# Patient Record
Sex: Male | Born: 1960 | Race: White
Health system: Northeastern US, Academic
[De-identification: ages and names within clinical notes are randomized; demographics above are authoritative.]

---

## 2004-08-13 ENCOUNTER — Emergency Department (HOSPITAL_COMMUNITY): Admission: EM | Admit: 2004-08-13 | Discharge: 2004-08-13 | Payer: Self-pay | Admitting: Emergency Medicine

## 2005-08-22 ENCOUNTER — Emergency Department (HOSPITAL_COMMUNITY): Admission: EM | Admit: 2005-08-22 | Discharge: 2005-08-22 | Payer: Self-pay | Admitting: Emergency Medicine

## 2016-12-02 LAB — CMP (EXT)
ALT/SGPT (EXT): 16 U/L (ref 10–49)
AST/SGOT (EXT): 18 U/L (ref 6–40)
Albumin (EXT): 4.6 g/dL (ref 3.5–4.8)
Alkaline Phosphatase (EXT): 79 U/L (ref 27–129)
Anion Gap (EXT): 13 mmol/L (ref 3–17)
BUN (EXT): 12 mg/dL (ref 9–23)
Bilirubin, Total (EXT): 0.3 mg/dL (ref 0.0–1.0)
CO2 (EXT): 28 mmol/L (ref 20–31)
CalciumCalcium (EXT): 9.3 mg/dL (ref 8.7–10.4)
Chloride (EXT): 102 mmol/L (ref 95–106)
Creatinine (EXT): 1 mg/dL (ref 0.50–1.30)
GFR Estimated (Calc) (EXT): 83 mL/min/{1.73_m2} (ref >60)
Globulin (EXT): 2.5 g/dL (ref 1.9–4.1)
Glucose (EXT): 76 mg/dL (ref 74–106)
Potassium (EXT): 4.3 mmol/L (ref 3.5–5.2)
Protein (EXT): 7.1 g/dL (ref 5.7–8.2)
Sodium (EXT): 143 mmol/L (ref 136–145)

## 2016-12-02 LAB — LIPID PROFILE (EXT)
Cholesterol (EXT): 182 mg/dL (ref 140–200)
HDL Cholesterol (EXT): 39 mg/dL — ABNORMAL LOW (ref >40)
LDL Cholesterol, CALC (EXT): 119 mg/dL (ref 0–129)
Risk Factor (EXT): 4.7 (ref 0–5)
Triglycerides (EXT): 118 mg/dL (ref 0–149)

## 2016-12-02 LAB — HEMOGLOBIN A1C
Estimated Average Glucose mg/dL (INT/EXT): 100 mg/dL
HEMOGLOBIN A1C % (INT/EXT): 5.1 % (ref 4.3–5.6)

## 2016-12-02 LAB — PSA SCREENING (EXT): PSA (EXT): 2.05 ng/mL (ref 0–4.00)

## 2016-12-02 LAB — UNMAPPED LAB RESULTS: Glucose (EXT): 87 mg/dL (ref 74–106)

## 2017-02-01 ENCOUNTER — Emergency Department (HOSPITAL_COMMUNITY)
Admission: EM | Admit: 2017-02-01 | Discharge: 2017-02-02 | Disposition: A | Discharge status: Home / Self Care | Payer: 59 | Attending: Emergency Medicine | Admitting: Emergency Medicine

## 2017-02-01 ENCOUNTER — Encounter (HOSPITAL_COMMUNITY): Payer: Self-pay

## 2017-02-01 DIAGNOSIS — S39012A Strain of muscle, fascia and tendon of lower back, initial encounter: Secondary | ICD-10-CM | POA: Diagnosis not present

## 2017-02-01 DIAGNOSIS — Y999 Unspecified external cause status: Secondary | ICD-10-CM | POA: Insufficient documentation

## 2017-02-01 DIAGNOSIS — Y93K9 Activity, other involving animal care: Secondary | ICD-10-CM | POA: Insufficient documentation

## 2017-02-01 DIAGNOSIS — S3992XA Unspecified injury of lower back, initial encounter: Secondary | ICD-10-CM | POA: Diagnosis present

## 2017-02-01 DIAGNOSIS — X58XXXA Exposure to other specified factors, initial encounter: Secondary | ICD-10-CM | POA: Insufficient documentation

## 2017-02-01 DIAGNOSIS — S61212A Laceration without foreign body of right middle finger without damage to nail, initial encounter: Secondary | ICD-10-CM | POA: Diagnosis not present

## 2017-02-01 DIAGNOSIS — F17200 Nicotine dependence, unspecified, uncomplicated: Secondary | ICD-10-CM | POA: Diagnosis not present

## 2017-02-01 DIAGNOSIS — Z23 Encounter for immunization: Secondary | ICD-10-CM | POA: Diagnosis not present

## 2017-02-01 DIAGNOSIS — Y929 Unspecified place or not applicable: Secondary | ICD-10-CM | POA: Insufficient documentation

## 2017-02-01 NOTE — ED Notes (Signed)
Pt reports he threw his back out trying to separate 2 dogs from fighting each other. Pt also reports he cut his 4th finger's knuckle but not in the dog incident.

## 2017-02-01 NOTE — ED Triage Notes (Signed)
Pt states his daughter was attacked by a dog so he intervened, c/o of pain to his lower back and small laceration to his R knuckle, bleeding controlled, pt denies being bitten by the dog, last tetanus unknown.

## 2017-02-02 MED ORDER — CYCLOBENZAPRINE HCL 10 MG PO TABS: 10.0000 mg | ORAL_TABLET | Freq: Three times a day (TID) | ORAL | 0 refills | Status: AC | PRN

## 2017-02-02 MED ORDER — TETANUS-DIPHTH-ACELL PERTUSSIS 5-2.5-18.5 LF-MCG/0.5 IM SUSP
0.5000 mL | Freq: Once | INTRAMUSCULAR | Status: AC
  Administered 2017-02-02: 0.5 mL via INTRAMUSCULAR
  Filled 2017-02-02: qty 0.5

## 2017-02-02 MED ORDER — CYCLOBENZAPRINE HCL 10 MG PO TABS
10.0000 mg | ORAL_TABLET | Freq: Once | ORAL | Status: AC
  Administered 2017-02-02: 10 mg via ORAL
  Filled 2017-02-02: qty 1

## 2017-02-02 NOTE — Discharge Instructions (Signed)
Ibuprofen 600 mg every 6 hours as needed for pain.  Flexeril as prescribed as needed for pain not relieved with ibuprofen.  Local wound care with bacitracin and dressing changes twice daily.  Wear finger splint for the next week.  Return to the emergency department if you develop increased redness, pain, pus draining from the wound, or other new and concerning symptoms.  Be sure to follow-up with your primary doctor if you are not improving in the next week.

## 2017-02-02 NOTE — ED Provider Notes (Signed)
[facility]   CSN: [id number] Arrival date & time: 02/01/17  2303     History   Chief Complaint Chief Complaint  Patient presents with  . Back Pain  . Laceration    HPI Scott Lowe is a 56 y.o. male.  Patient is a 56 year old male who presents with complaints of back pain and a right middle finger injury.  He assisted his daughter who is being attacked by the family dog.  He reports lifting the dog to keep it away from her when he developed the pain in his low back and lacerated his finger on an unknown object.  He is very certain that the laceration is not related to the dog biting him.  He is uncertain of his last tetanus shot.  He denies any radiation of his pain into his legs.  He denies any bowel or bladder complaints.   The history is provided by the patient.  Back Pain   This is a new problem. The current episode started 1 to 2 hours ago. The problem occurs constantly. The problem has not changed since onset.The pain is associated with lifting heavy objects. The pain is present in the lumbar spine. The quality of the pain is described as stabbing. The pain does not radiate. The pain is moderate. The symptoms are aggravated by bending, twisting and certain positions. The pain is the same all the time. Pertinent negatives include no fever, no bowel incontinence, no bladder incontinence, no paresis, no tingling and no weakness. He has tried nothing for the symptoms.  Laceration      History reviewed. No pertinent past medical history.  There are no active problems to display for this patient.   History reviewed. No pertinent surgical history.     Home Medications    Prior to Admission medications   Not on File    Family History No family history on file.  Social History Social History   Tobacco Use  . Smoking status: Current Every Day Smoker  . Smokeless tobacco: Never Used  Substance Use Topics  .  Alcohol use: Yes    Frequency: Never  . Drug use: No     Allergies   Patient has no known allergies.   Review of Systems Review of Systems  Constitutional: Negative for fever.  Gastrointestinal: Negative for bowel incontinence.  Genitourinary: Negative for bladder incontinence.  Musculoskeletal: Positive for back pain.  Neurological: Negative for tingling and weakness.  All other systems reviewed and are negative.    Physical Exam Updated Vital Signs BP 117/62   Pulse 63   Temp 98.3 F (36.8 C)   Resp 18   SpO2 97%   Physical Exam  Constitutional: He is oriented to person, place, and time. He appears well-developed and well-nourished. No distress.  HENT:  Head: Normocephalic and atraumatic.  Neck: Normal range of motion. Neck supple.  Pulmonary/Chest: Effort normal.  Musculoskeletal:  There is no tenderness, deformity, or step-off to the lumbar region.  There is a 1 cm laceration to the dorsal aspect of the PIP joint of the right middle finger.  Laceration appears superficial and the edges are well approximated.  He has good range of motion with no deformity.  Cap refill is brisk and sensation is intact.  Neurological: He is alert and oriented to person, place, and time.  DTRs are 1+ and symmetrical in the patellar and Achilles tendons bilaterally.  Strength is 5 out of 5 in  both lower extremities.  He is ambulatory without difficulty and able to stand on his heels and toes.  Skin: He is not diaphoretic.  Nursing note and vitals reviewed.    ED Treatments / Results  Labs (all labs ordered are listed, but only abnormal results are displayed) Labs Reviewed - No data to display  EKG  EKG Interpretation None       Radiology No results found.  Procedures Procedures (including critical care time)  Medications Ordered in ED Medications  Tdap (BOOSTRIX) injection 0.5 mL (not administered)  cyclobenzaprine (FLEXERIL) tablet 10 mg (not administered)      Initial Impression / Assessment and Plan / ED Course  I have reviewed the triage vital signs and the nursing notes.  Pertinent labs & imaging results that were available during my care of the patient were reviewed by me and considered in my medical decision making (see chart for details).  Patient with what appears to be a low back strain from lifting an attacking dog.  He has no red flags that would suggest an emergent situation.  He will be treated with anti-inflammatories, muscle relaxers, and follow-up as needed.  The laceration to the finger is not in need of repair.  This will be treated with local wound care and a finger splint.  His tetanus will be updated.  To return as needed for any problems and follow-up with primary doctor if not improving.  Final Clinical Impressions(s) / ED Diagnoses   Final diagnoses:  None    ED Discharge Orders    None       Geoffery Lyonselo, [place], MD 02/02/17 0021

## 2017-11-11 LAB — LIPID PROFILE (EXT)
Cholesterol (EXT): 168 mg/dL (ref 140–200)
HDL Cholesterol (EXT): 40 mg/dL — ABNORMAL LOW (ref >40)
LDL Cholesterol, CALC (EXT): 106 mg/dL (ref 0–129)
NON HDL Cholesterol (EXT): 128 mg/dL
Risk Factor (EXT): 4.2 (ref 0–5)
Triglycerides (EXT): 109 mg/dL (ref 0–149)

## 2017-11-11 LAB — UNMAPPED LAB RESULTS: Glucose (EXT): 84 mg/dL (ref 74–106)

## 2017-11-11 LAB — CMP (EXT)
ALT/SGPT (EXT): 13 U/L (ref 10–49)
AST/SGOT (EXT): 19 U/L (ref 6–40)
Albumin (EXT): 4.3 g/dL (ref 3.5–4.8)
Alkaline Phosphatase (EXT): 62 U/L (ref 27–129)
Anion Gap (EXT): 12 mmol/L (ref 3–17)
BUN (EXT): 16 mg/dL (ref 9–23)
Bilirubin, Total (EXT): 0.5 mg/dL (ref 0.0–1.0)
CO2 (EXT): 26 mmol/L (ref 20–31)
CalciumCalcium (EXT): 9.3 mg/dL (ref 8.7–10.4)
Chloride (EXT): 105 mmol/L (ref 95–106)
Creatinine (EXT): 0.99 mg/dL (ref 0.50–1.30)
GFR Estimated (Calc) (EXT): 84 mL/min/{1.73_m2} (ref >60)
Globulin (EXT): 2.7 g/dL (ref 1.9–4.1)
Potassium (EXT): 3.5 mmol/L (ref 3.5–5.2)
Protein (EXT): 7 g/dL (ref 5.7–8.2)
Sodium (EXT): 143 mmol/L (ref 136–145)

## 2017-11-11 LAB — PSA SCREENING (EXT): PSA (EXT): 1.75 ng/mL (ref 0–4.00)

## 2017-11-11 LAB — HEMOGLOBIN A1C
Estimated Average Glucose mg/dL (INT/EXT): 105 mg/dL
HEMOGLOBIN A1C % (INT/EXT): 5.3 % (ref 4.3–5.6)

## 2018-11-10 LAB — CMP (EXT)
ALT/SGPT (EXT): 17 U/L (ref 10–49)
AST/SGOT (EXT): 22 U/L (ref 6–40)
Albumin (EXT): 4.7 g/dL (ref 3.5–4.8)
Alkaline Phosphatase (EXT): 66 U/L (ref 27–129)
Anion Gap (EXT): 13 mmol/L (ref 3–17)
BUN (EXT): 11 mg/dL (ref 9–23)
Bilirubin, Total (EXT): 0.5 mg/dL (ref 0.0–1.0)
CO2 (EXT): 27 mmol/L (ref 20–31)
CalciumCalcium (EXT): 9.5 mg/dL (ref 8.7–10.4)
Chloride (EXT): 103 mmol/L (ref 95–106)
Creatinine (EXT): 1.06 mg/dL (ref 0.50–1.30)
GFR Estimated (Calc) (EXT): 76 mL/min/{1.73_m2} (ref >60)
Globulin (EXT): 2.8 g/dL (ref 1.9–4.1)
Glucose (EXT): 65 mg/dL — ABNORMAL LOW (ref 74–106)
Potassium (EXT): 4 mmol/L (ref 3.5–5.2)
Protein (EXT): 7.5 g/dL (ref 5.7–8.2)
Sodium (EXT): 143 mmol/L (ref 136–145)

## 2018-11-10 LAB — UNMAPPED LAB RESULTS: Glucose (EXT): 88 mg/dL (ref 74–106)

## 2018-11-10 LAB — LIPID PROFILE (EXT)
Cholesterol (EXT): 185 mg/dL (ref 140–200)
HDL Cholesterol (EXT): 37 mg/dL — ABNORMAL LOW (ref >40)
LDL Cholesterol, CALC (EXT): 112 mg/dL (ref 0–129)
NON HDL Cholesterol (EXT): 148 mg/dL
Risk Factor (EXT): 5 (ref 0–5)
Triglycerides (EXT): 180 mg/dL — ABNORMAL HIGH (ref 0–149)

## 2018-11-10 LAB — HEMOGLOBIN A1C
Estimated Average Glucose mg/dL (INT/EXT): 105 mg/dL
HEMOGLOBIN A1C % (INT/EXT): 5.3 % (ref 4.3–5.6)

## 2018-11-10 LAB — PSA SCREENING (EXT): PSA (EXT): 2.28 ng/mL (ref 0–4.00)

## 2019-09-20 LAB — CMP (EXT)
ALT/SGPT (EXT): 17 U/L (ref 10–49)
AST/SGOT (EXT): 19 U/L (ref 6–40)
Albumin (EXT): 4.6 g/dL (ref 3.5–4.8)
Alkaline Phosphatase (EXT): 57 U/L (ref 27–129)
Anion Gap (EXT): 11 mmol/L (ref 3–17)
BUN (EXT): 11 mg/dL (ref 9–23)
Bilirubin, Total (EXT): 0.5 mg/dL (ref 0.0–1.0)
CO2 (EXT): 27 mmol/L (ref 20–31)
CalciumCalcium (EXT): 9.3 mg/dL (ref 8.7–10.4)
Chloride (EXT): 102 mmol/L (ref 95–106)
Creatinine (EXT): 1 mg/dL (ref 0.50–1.30)
GFR Estimated (Calc) (EXT): 81 mL/min/{1.73_m2} (ref >60)
Globulin (EXT): 2.6 g/dL (ref 1.9–4.1)
Potassium (EXT): 3.4 mmol/L — ABNORMAL LOW (ref 3.5–5.2)
Protein (EXT): 7.2 g/dL (ref 5.7–8.2)
Sodium (EXT): 140 mmol/L (ref 136–145)

## 2019-09-20 LAB — HEMOGLOBIN A1C
Estimated Average Glucose mg/dL (INT/EXT): 103 mg/dL
HEMOGLOBIN A1C % (INT/EXT): 5.2 % (ref 4.3–5.6)

## 2019-09-20 LAB — LIPID PROFILE (EXT)
Cholesterol (EXT): 172 mg/dL (ref 140–200)
HDL Cholesterol (EXT): 42 mg/dL (ref >40)
LDL Cholesterol, CALC (EXT): 113 mg/dL (ref 0–129)
NON HDL Cholesterol (EXT): 130 mg/dL
Risk Factor (EXT): 4.1 (ref 0–5)
Triglycerides (EXT): 87 mg/dL (ref 0–149)

## 2019-09-20 LAB — UNMAPPED LAB RESULTS: Glucose (EXT): 81 mg/dL (ref 74–106)

## 2019-09-20 LAB — PSA SCREENING (EXT): PSA (EXT): 2.32 ng/mL (ref 0–4.00)

## 2019-12-12 LAB — BMP (EXT)
Anion Gap (EXT): 11 mmol/L (ref 3–17)
BUN (EXT): 13 mg/dL (ref 9–23)
CO2 (EXT): 26 mmol/L (ref 20–31)
CalciumCalcium (EXT): 9.2 mg/dL (ref 8.7–10.4)
Chloride (EXT): 105 mmol/L (ref 95–106)
Creatinine (EXT): 1 mg/dL (ref 0.50–1.30)
GFR Estimated (Calc) (EXT): 81 mL/min/{1.73_m2} (ref >60)
Glucose (EXT): 101 mg/dL (ref 74–106)
Potassium (EXT): 4.4 mmol/L (ref 3.5–5.2)
Sodium (EXT): 142 mmol/L (ref 136–145)

## 2020-09-20 LAB — CMP (EXT)
ALT/SGPT (EXT): 16 U/L (ref 10–49)
AST/SGOT (EXT): 21 U/L (ref 6–40)
Albumin (EXT): 4.1 g/dL (ref 3.5–4.8)
Alkaline Phosphatase (EXT): 60 U/L (ref 27–129)
Anion Gap (EXT): 11 mmol/L (ref 3–17)
BUN (EXT): 16 mg/dL (ref 9–23)
Bilirubin, Total (EXT): 0.3 mg/dL (ref 0.0–1.0)
CO2 (EXT): 26 mmol/L (ref 20–31)
CalciumCalcium (EXT): 9.3 mg/dL (ref 8.7–10.4)
Chloride (EXT): 102 mmol/L (ref 95–106)
Creatinine (EXT): 0.99 mg/dL (ref 0.50–1.30)
GFR Estimated (Calc) (EXT): 87 mL/min/{1.73_m2} (ref >60)
Globulin (EXT): 2.6 g/dL (ref 1.9–4.1)
Glucose (EXT): 62 mg/dL — ABNORMAL LOW (ref 74–106)
Potassium (EXT): 3.4 mmol/L — ABNORMAL LOW (ref 3.5–5.2)
Protein (EXT): 6.7 g/dL (ref 5.7–8.2)
Sodium (EXT): 139 mmol/L (ref 136–145)

## 2020-09-20 LAB — LIPID PROFILE (EXT)
Cholesterol (EXT): 191 mg/dL (ref 140–200)
HDL Cholesterol (EXT): 38 mg/dL — ABNORMAL LOW (ref >40)
LDL Cholesterol, CALC (EXT): 122 mg/dL (ref 0–129)
NON HDL Cholesterol (EXT): 153 mg/dL
Risk Factor (EXT): 5 (ref 0–5)
Triglycerides (EXT): 154 mg/dL — ABNORMAL HIGH (ref 0–149)

## 2020-09-20 LAB — UNMAPPED LAB RESULTS: Glucose (EXT): 79 mg/dL (ref 74–106)

## 2020-09-20 LAB — HEMOGLOBIN A1C
Estimated Average Glucose mg/dL (INT/EXT): 97 mg/dL
HEMOGLOBIN A1C % (INT/EXT): 5 % (ref 4.3–5.6)

## 2020-09-20 LAB — PSA SCREENING (EXT): PSA (EXT): 1.67 ng/mL (ref 0–4.00)

## 2020-12-05 ENCOUNTER — Other Ambulatory Visit: Payer: Self-pay | Admitting: Family Medicine

## 2020-12-05 DIAGNOSIS — I739 Peripheral vascular disease, unspecified: Secondary | ICD-10-CM

## 2020-12-11 ENCOUNTER — Ambulatory Visit
Admission: RE | Admit: 2020-12-11 | Discharge: 2020-12-11 | Disposition: A | Discharge status: Home / Self Care | Payer: 59 | Source: Ambulatory Visit | Attending: Family Medicine | Admitting: Family Medicine

## 2020-12-11 DIAGNOSIS — I739 Peripheral vascular disease, unspecified: Secondary | ICD-10-CM

## 2021-01-24 ENCOUNTER — Other Ambulatory Visit: Payer: Self-pay | Admitting: *Deleted

## 2021-01-24 DIAGNOSIS — F17210 Nicotine dependence, cigarettes, uncomplicated: Secondary | ICD-10-CM

## 2021-02-25 ENCOUNTER — Telehealth (INDEPENDENT_AMBULATORY_CARE_PROVIDER_SITE_OTHER): Payer: 59 | Admitting: Acute Care

## 2021-02-25 ENCOUNTER — Encounter: Payer: Self-pay | Admitting: Acute Care

## 2021-02-25 ENCOUNTER — Other Ambulatory Visit: Payer: Self-pay

## 2021-02-25 DIAGNOSIS — F17210 Nicotine dependence, cigarettes, uncomplicated: Secondary | ICD-10-CM | POA: Diagnosis not present

## 2021-02-25 NOTE — Patient Instructions (Signed)
Thank you for participating in the [place] [facility] Cancer Screening Program. °It was our pleasure to meet you today. °We will call you with the results of your scan within the next few days. °Your scan will be assigned a Lung RADS category score by the physicians reading the scans.  °This Lung RADS score determines follow up scanning.  °See below for description of categories, and follow up screening recommendations. °We will be in touch to schedule your follow up screening annually or based on recommendations of our providers. °We will fax a copy of your scan results to your Primary Care Physician, or the physician who referred you to the program, to ensure they have the results. °Please call the office if you have any questions or concerns regarding your scanning experience or results.  °Our office number is [phone]. °Please speak with Denise Phelps, RN. She is our Lung Cancer Screening RN. °If she is unavailable when you call, please have the office staff send her a message. She will return your call at her earliest convenience. °Remember, if your scan is normal, we will scan you annually as long as you continue to meet the criteria for the program. (Age 55-77, Current smoker or smoker who has quit within the last 15 years). °If you are a smoker, remember, quitting is the single most powerful action that you can take to decrease your risk of lung cancer and other pulmonary, breathing related problems. °We know quitting is hard, and we are here to help.  °Please let us know if there is anything we can do to help you meet your goal of quitting. °If you are a former smoker, congratulations. We are proud of you! Remain smoke free! °Remember you can refer friends or family members through the number above.  °We will screen them to make sure they meet criteria for the program. °Thank you for helping us take better care of you by participating in Lung Screening. ° °You can receive free nicotine replacement therapy  ( patches, gum or mints) by calling 1-800-QUIT NOW. Please call so we can get you on the path to becoming  a non-smoker. I know it is hard, but you can do this! ° °Lung RADS Categories: ° °Lung RADS 1: no nodules or definitely non-concerning nodules.  °Recommendation is for a repeat annual scan in 12 months. ° °Lung RADS 2:  nodules that are non-concerning in appearance and behavior with a very low likelihood of becoming an active cancer. °Recommendation is for a repeat annual scan in 12 months. ° °Lung RADS 3: nodules that are probably non-concerning , includes nodules with a low likelihood of becoming an active cancer.  Recommendation is for a 6-month repeat screening scan. Often noted after an upper respiratory illness. We will be in touch to make sure you have no questions, and to schedule your 6-month scan. ° °Lung RADS 4 A: nodules with concerning findings, recommendation is most often for a follow up scan in 3 months or additional testing based on our provider's assessment of the scan. We will be in touch to make sure you have no questions and to schedule the recommended 3 month follow up scan. ° °Lung RADS 4 B:  indicates findings that are concerning. We will be in touch with you to schedule additional diagnostic testing based on our provider's  assessment of the scan. ° °Hypnosis for smoking cessation  °[employer] °[phone] ° °Acupuncture for smoking cessation  °[facility] °[phone]  °

## 2021-02-25 NOTE — Progress Notes (Signed)
Virtual Visit via Video Note  I connected with Scott Lowe on 02/25/21 at  4:30 PM EST by a video enabled telemedicine application and verified that I am speaking with the correct person using two identifiers.  Location: Patient: At home Provider: [address], [place], [place], [address]    I discussed the limitations of evaluation and management by telemedicine and the availability of in person appointments. The patient expressed understanding and agreed to proceed.  Shared Decision Making Visit Lung Cancer Screening Program [phone])   Eligibility: Age 60 y.o. Pack Years Smoking History Calculation 33 pack year smoking history (# packs/per year x # years smoked) Recent History of coughing up blood  no Unexplained weight loss? no ( >Than 15 pounds within the last 6 months ) Prior History Lung / other cancer no (Diagnosis within the last 5 years already requiring surveillance chest CT Scans). Smoking Status Current Smoker Former Smokers: Years since quit: NA  Quit Date:  NA  Visit Components: Discussion included one or more decision making aids. yes Discussion included risk/benefits of screening. yes Discussion included potential follow up diagnostic testing for abnormal scans. yes Discussion included meaning and risk of over diagnosis. yes Discussion included meaning and risk of False Positives. yes Discussion included meaning of total radiation exposure. yes  Counseling Included: Importance of adherence to annual lung cancer LDCT screening. yes Impact of comorbidities on ability to participate in the program. yes Ability and willingness to under diagnostic treatment. yes  Smoking Cessation Counseling: Current Smokers:  Discussed importance of smoking cessation. yes Information about tobacco cessation classes and interventions provided to patient. yes Patient provided with "ticket" for LDCT Scan. yes Symptomatic Patient. no  Counseling NA Diagnosis Code:  Tobacco Use Z72.0 Asymptomatic Patient yes  Counseling (Intermediate counseling: > three minutes counseling) D7412 Former Smokers:  Discussed the importance of maintaining cigarette abstinence. yes Diagnosis Code: Personal History of Nicotine Dependence. I78.676 Information about tobacco cessation classes and interventions provided to patient. Yes Patient provided with "ticket" for LDCT Scan. yes Written Order for Lung Cancer Screening with LDCT placed in Epic. Yes (CT Chest Lung Cancer Screening Low Dose W/O CM) [id number] Z12.2-Screening of respiratory organs Z87.891-Personal history of nicotine dependence  I have spent 25 minutes of face to face/ virtual visit   time with Scott Lowe discussing the risks and benefits of lung cancer screening. We viewed / discussed a power point together that explained in detail the above noted topics. We paused at intervals to allow for questions to be asked and answered to ensure understanding.We discussed that the single most powerful action that he can take to decrease his risk of developing lung cancer is to quit smoking. We discussed whether or not he is ready to commit to setting a quit date. We discussed options for tools to aid in quitting smoking including nicotine replacement therapy, non-nicotine medications, support groups, Quit Smart classes, and behavior modification. We discussed that often times setting smaller, more achievable goals, such as eliminating 1 cigarette a day for a week and then 2 cigarettes a day for a week can be helpful in slowly decreasing the number of cigarettes smoked. This allows for a sense of accomplishment as well as providing a clinical benefit. I provided  him  with smoking cessation  information  with contact information for community resources, classes, free nicotine replacement therapy, and access to mobile apps, text messaging, and on-line smoking cessation help. I have also provided  him  the office contact  information in  the event he needs to contact me, or the screening staff. We discussed the time and location of the scan, and that either Abigail Miyamoto RN, Karlton Lemon, RN  or I will call / send a letter with the results within 24-72 hours of receiving them. The patient verbalized understanding of all of  the above and had no further questions upon leaving the office. They have my contact information in the event they have any further questions.  I spent 3 minutes counseling on smoking cessation and the health risks of continued tobacco abuse.  I explained to the patient that there has been a high incidence of coronary artery disease noted on these exams. I explained that this is a non-gated exam therefore degree or severity cannot be determined. This patient is on statin therapy. I have asked the patient to follow-up with their PCP regarding any incidental finding of coronary artery disease and management with diet or medication as their PCP  feels is clinically indicated. The patient verbalized understanding of the above and had no further questions upon completion of the visit.      Bevelyn Ngo, NP 02/25/2021

## 2021-02-26 ENCOUNTER — Ambulatory Visit
Admission: RE | Admit: 2021-02-26 | Discharge: 2021-02-26 | Disposition: A | Discharge status: Home / Self Care | Payer: 59 | Source: Ambulatory Visit | Attending: Acute Care | Admitting: Acute Care

## 2021-02-26 DIAGNOSIS — F17210 Nicotine dependence, cigarettes, uncomplicated: Secondary | ICD-10-CM

## 2021-03-12 ENCOUNTER — Other Ambulatory Visit: Payer: Self-pay | Admitting: Acute Care

## 2021-03-12 DIAGNOSIS — Z87891 Personal history of nicotine dependence: Secondary | ICD-10-CM

## 2021-03-12 DIAGNOSIS — F17210 Nicotine dependence, cigarettes, uncomplicated: Secondary | ICD-10-CM

## 2021-04-18 LAB — BMP (EXT)
Anion Gap (EXT): 10 mmol/L (ref 3–17)
BUN (EXT): 17 mg/dL (ref 9–23)
CO2 (EXT): 26 mmol/L (ref 20–31)
CalciumCalcium (EXT): 9 mg/dL (ref 8.7–10.4)
Chloride (EXT): 105 mmol/L (ref 95–106)
Creatinine (EXT): 0.98 mg/dL (ref 0.50–1.30)
GFR Estimated (Calc) (EXT): 88 mL/min/{1.73_m2} (ref >60)
Glucose (EXT): 93 mg/dL (ref 74–106)
Potassium (EXT): 3.7 mmol/L (ref 3.5–5.2)
Sodium (EXT): 141 mmol/L (ref 136–145)

## 2021-09-25 LAB — BMP (EXT)
Anion Gap (EXT): 12 mmol/L (ref 3–17)
BUN (EXT): 16 mg/dL (ref 9–23)
CO2 (EXT): 25 mmol/L (ref 20–31)
CalciumCalcium (EXT): 9.5 mg/dL (ref 8.7–10.4)
Chloride (EXT): 103 mmol/L (ref 95–106)
Creatinine (EXT): 0.95 mg/dL (ref 0.50–1.30)
GFR Estimated (Calc) (EXT): 91 mL/min/{1.73_m2} (ref >60)
Potassium (EXT): 4.4 mmol/L (ref 3.5–5.2)
Sodium (EXT): 140 mmol/L (ref 136–145)

## 2021-09-25 LAB — LIPID PROFILE (EXT)
Cholesterol (EXT): 209 mg/dL — ABNORMAL HIGH (ref 140–200)
HDL Cholesterol (EXT): 38 mg/dL — ABNORMAL LOW (ref >40)
LDL Cholesterol, CALC (EXT): 136 mg/dL — ABNORMAL HIGH (ref 0–129)
NON HDL Cholesterol (EXT): 171 mg/dL
Risk Factor (EXT): 5.5 — ABNORMAL HIGH (ref 0–5)
Triglycerides (EXT): 175 mg/dL — ABNORMAL HIGH (ref 0–149)

## 2021-09-25 LAB — HEMOGLOBIN A1C
Estimated Average Glucose mg/dL (INT/EXT): 111 mg/dL
HEMOGLOBIN A1C % (INT/EXT): 5.5 % (ref 4.3–5.6)

## 2021-09-25 LAB — UNMAPPED LAB RESULTS: Glucose (EXT): 93 mg/dL (ref 74–106)

## 2021-09-25 LAB — PSA SCREENING (EXT): PSA (EXT): 2.74 ng/mL (ref 0–4.00)

## 2022-02-26 ENCOUNTER — Ambulatory Visit
Admission: RE | Admit: 2022-02-26 | Discharge: 2022-02-26 | Disposition: A | Discharge status: Home / Self Care | Payer: 59 | Source: Ambulatory Visit | Attending: Family Medicine | Admitting: Family Medicine

## 2022-02-26 DIAGNOSIS — F17210 Nicotine dependence, cigarettes, uncomplicated: Secondary | ICD-10-CM

## 2022-02-26 DIAGNOSIS — Z87891 Personal history of nicotine dependence: Secondary | ICD-10-CM

## 2022-02-28 ENCOUNTER — Other Ambulatory Visit: Payer: Self-pay

## 2022-02-28 DIAGNOSIS — F17210 Nicotine dependence, cigarettes, uncomplicated: Secondary | ICD-10-CM

## 2022-02-28 DIAGNOSIS — Z87891 Personal history of nicotine dependence: Secondary | ICD-10-CM

## 2022-02-28 DIAGNOSIS — Z122 Encounter for screening for malignant neoplasm of respiratory organs: Secondary | ICD-10-CM

## 2022-06-04 NOTE — Telephone Encounter (Addendum)
error 

## 2022-06-04 NOTE — Telephone Encounter (Signed)
This is the wrong chart plse resend note in correct chart

## 2022-07-19 LAB — BMP (EXT)
Anion Gap (EXT): 9 mmol/L (ref 3–17)
BUN (EXT): 14 mg/dL (ref 9–23)
CO2 (EXT): 27 mmol/L (ref 20–31)
CalciumCalcium (EXT): 9.4 mg/dL (ref 8.7–10.4)
Chloride (EXT): 105 mmol/L (ref 95–106)
Creatinine (EXT): 0.93 mg/dL (ref 0.50–1.30)
GFR Estimated (Calc) (EXT): 92 mL/min/{1.73_m2} (ref >60)
Glucose (EXT): 94 mg/dL (ref 74–106)
Potassium (EXT): 4 mmol/L (ref 3.5–5.2)
Sodium (EXT): 141 mmol/L (ref 136–145)

## 2022-09-12 LAB — LIPID PROFILE (EXT)
Cholesterol (EXT): 139 mg/dL — ABNORMAL LOW (ref 140–200)
HDL Cholesterol (EXT): 36 mg/dL — ABNORMAL LOW (ref >40)
LDL Cholesterol, CALC (EXT): 81 mg/dL (ref 0–129)
NON HDL Cholesterol (EXT): 103 mg/dL
Risk Factor (EXT): 3.9 (ref 0–5)
Triglycerides (EXT): 110 mg/dL (ref 0–149)

## 2022-10-06 LAB — CMP (EXT)
ALT/SGPT (EXT): 38 U/L (ref 10–49)
AST/SGOT (EXT): 38 U/L (ref 6–40)
Albumin (EXT): 4.4 g/dL (ref 3.5–4.8)
Alkaline Phosphatase (EXT): 65 U/L (ref 27–129)
Anion Gap (EXT): 12 mmol/L (ref 3–17)
BUN (EXT): 18 mg/dL (ref 9–23)
Bilirubin, Total (EXT): 0.3 mg/dL (ref 0.0–1.0)
CO2 (EXT): 26 mmol/L (ref 20–31)
CalciumCalcium (EXT): 9.4 mg/dL (ref 8.7–10.4)
Chloride (EXT): 104 mmol/L (ref 95–106)
Creatinine (EXT): 1.01 mg/dL (ref 0.50–1.30)
GFR Estimated (Calc) (EXT): 84 mL/min/{1.73_m2} (ref >60)
Globulin (EXT): 2.6 g/dL (ref 1.9–4.1)
Potassium (EXT): 3.6 mmol/L (ref 3.5–5.2)
Protein (EXT): 7 g/dL (ref 5.7–8.2)
Sodium (EXT): 142 mmol/L (ref 136–145)

## 2022-10-06 LAB — LIPID PROFILE (EXT)
Cholesterol (EXT): 115 mg/dL — ABNORMAL LOW (ref 140–200)
HDL Cholesterol (EXT): 41 mg/dL (ref >40)
LDL Cholesterol, CALC (EXT): 58 mg/dL (ref 0–129)
NON HDL Cholesterol (EXT): 74 mg/dL
Risk Factor (EXT): 2.8 (ref 0–5)
Triglycerides (EXT): 80 mg/dL (ref 0–149)

## 2022-10-06 LAB — PSA SCREENING (EXT): PSA (EXT): 2.14 ng/mL (ref 0–4.00)

## 2022-10-06 LAB — HEMOGLOBIN A1C
Estimated Average Glucose mg/dL (INT/EXT): 108 mg/dL
HEMOGLOBIN A1C % (INT/EXT): 5.4 % (ref 4.3–5.6)

## 2022-10-06 LAB — UNMAPPED LAB RESULTS: Glucose (EXT): 89 mg/dL (ref 74–106)

## 2022-12-22 IMAGING — CT CT CHEST LUNG CANCER SCREENING LOW DOSE W/O CM
2 of 5 series · 15 of 40 positions shown, 18 images · non-contrast
Comparison: None.

CLINICAL DATA: Current smoker, 33 pack-year history.

EXAM:
CT CHEST WITHOUT CONTRAST LOW-DOSE FOR LUNG CANCER SCREENING
TECHNIQUE: Multidetector CT imaging of the chest was performed following the
standard protocol without IV contrast.

[Series 4: lung 1.00 br44 cor · coronal · 0.65mm/px · 3 of 379 slices shown]
[im 76/379  lung]
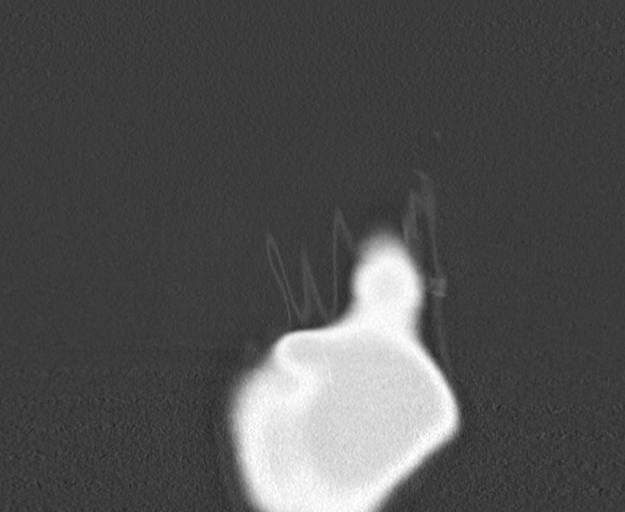
[im 152/379  lung]
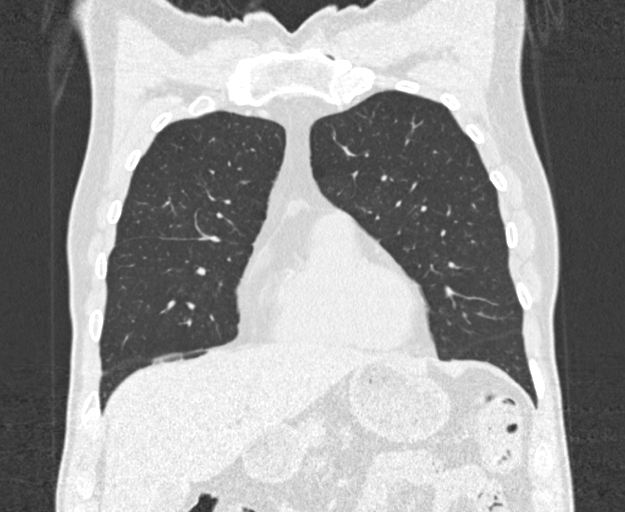
[im 227/379  lung]
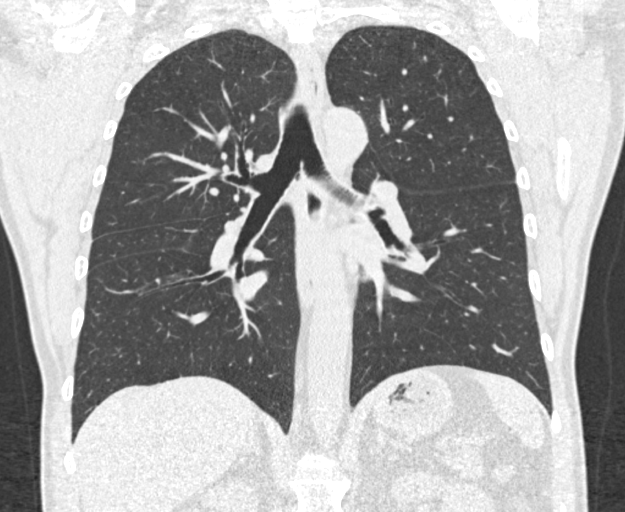

[Series 9: lung 1.00 br60 axial · axial · 0.79mm/px · z∈[-1053,-753]mm · 12 of 331 slices shown, 15 images]
[im 16/331  mediastinal]
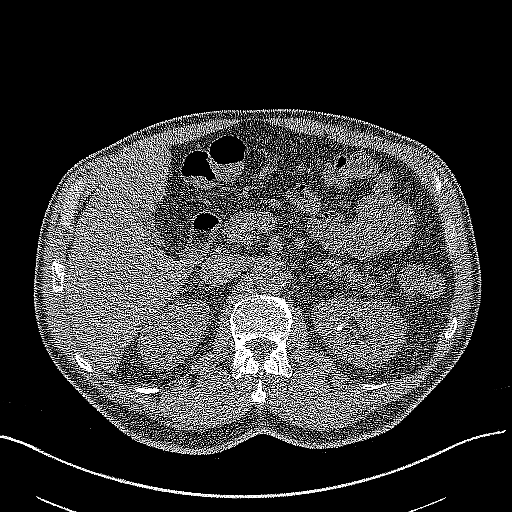
[im 16/331  lung]
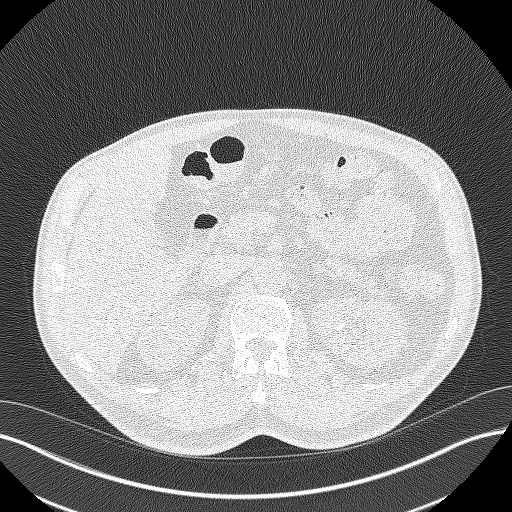
[im 46/331  lung]
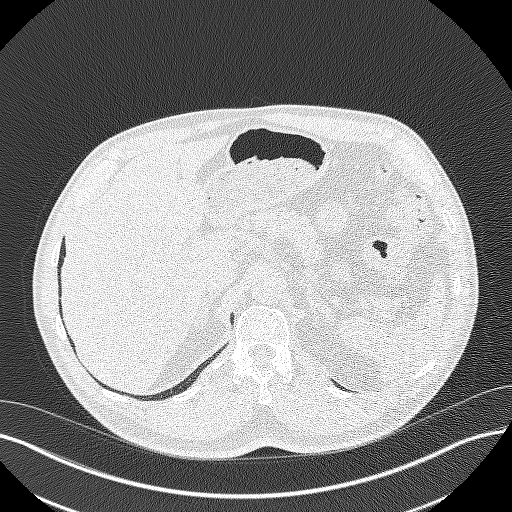
[im 76/331  lung]
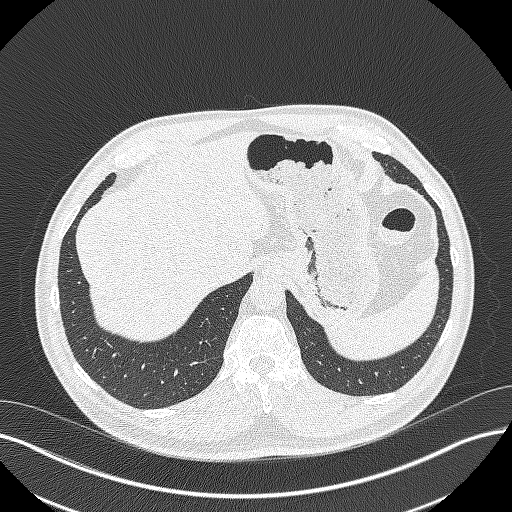
[im 106/331  lung]
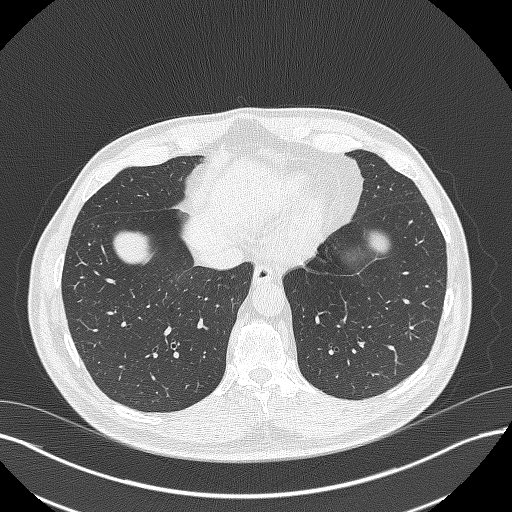
[im 121/331  mediastinal]
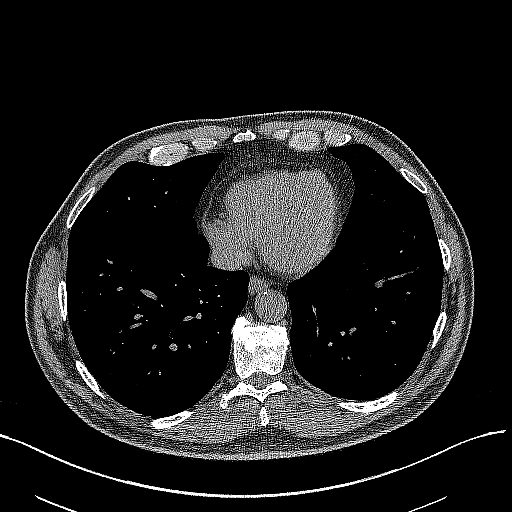
[im 121/331  lung]
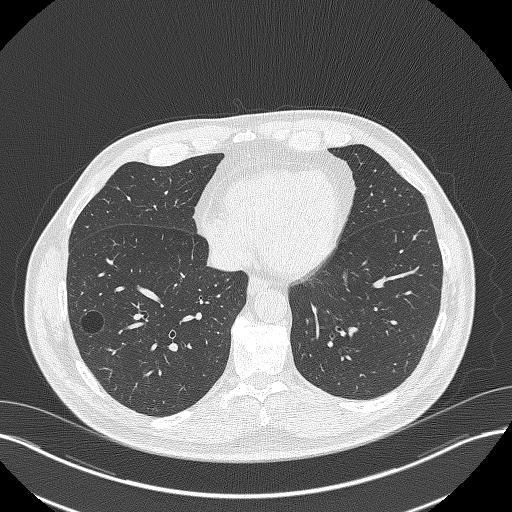
[im 151/331  lung]
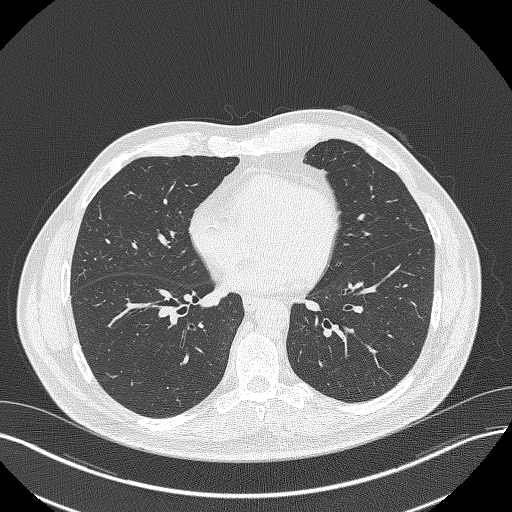
[im 181/331  lung]
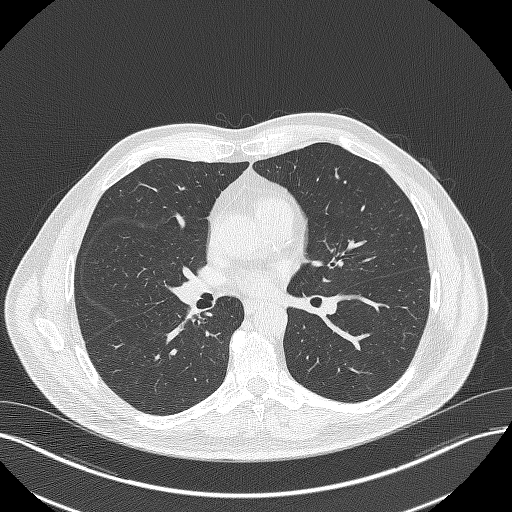
[im 211/331  lung]
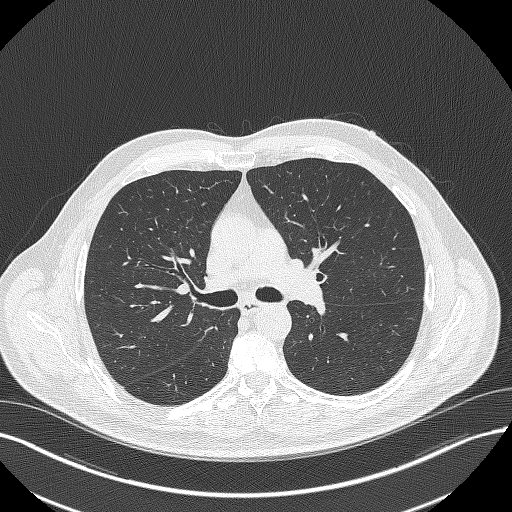
[im 226/331  mediastinal]
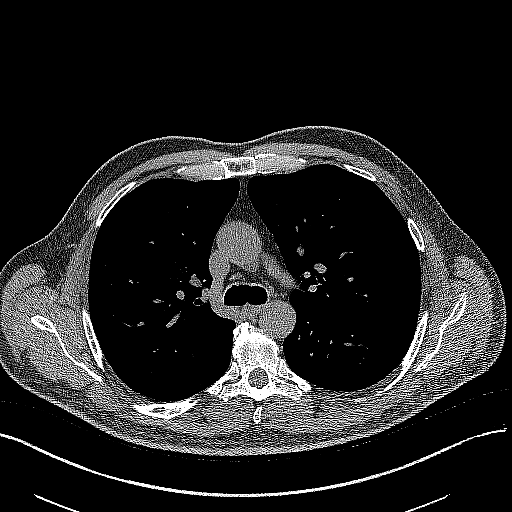
[im 226/331  lung]
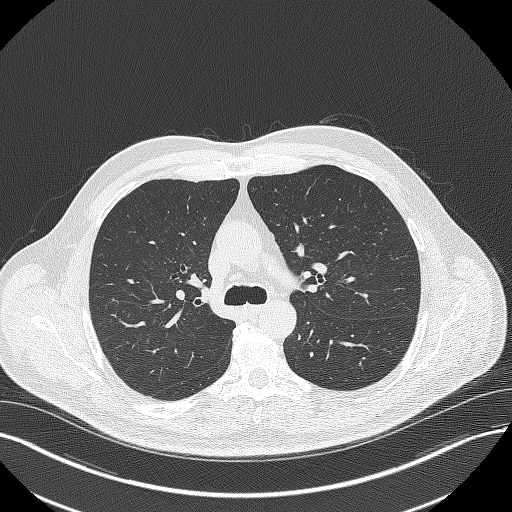
[im 256/331  lung]
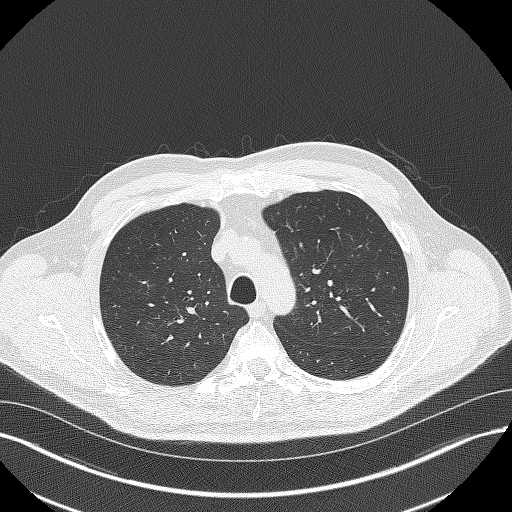
[im 286/331  lung]
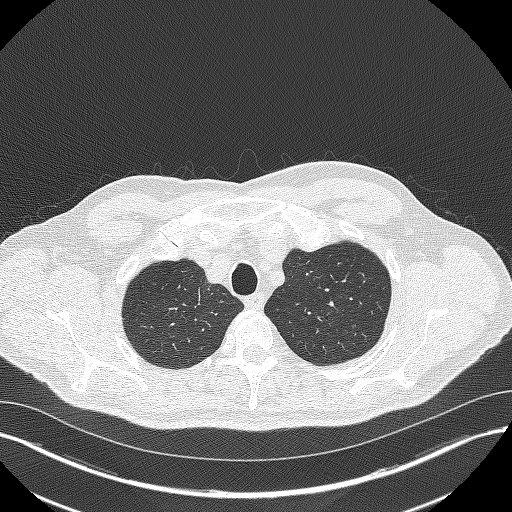
[im 316/331  lung]
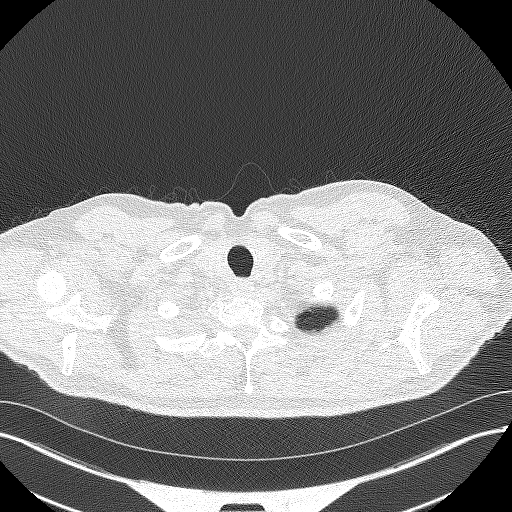

[15 of 40 positions shown; findings below may reference images not displayed]

FINDINGS: Cardiovascular: Atherosclerotic calcification of the aorta, aortic
valve and coronary arteries. Heart size normal. No pericardial
effusion.

Mediastinum/Nodes: No pathologically enlarged mediastinal or
axillary lymph nodes. Hilar regions are difficult to definitively
evaluate without IV contrast but appear grossly unremarkable.
Esophagus is grossly unremarkable.

Lungs/Pleura: Centrilobular and paraseptal emphysema. Smoking
related respiratory bronchiolitis. Calcified granuloma. No
suspicious pulmonary nodules. No pleural fluid. Airway is
unremarkable.

Upper Abdomen: Visualized portions of the liver, gallbladder,
adrenal glands and right kidney are unremarkable. Small stone in the
left kidney. Visualized portions of the spleen, pancreas, stomach
and bowel are otherwise unremarkable. No upper abdominal adenopathy.

Musculoskeletal: Degenerative changes in the spine. No worrisome
lytic or sclerotic lesions.
IMPRESSION: 1. Lung-RADS 1, negative. Continue annual screening with low-dose
chest CT without contrast in 12 months.
2. Left renal stone.
3. Aortic atherosclerosis ([id number]-611.1). Coronary artery
calcification.
4.  Emphysema ([id number]).

## 2023-01-27 LAB — CMP (EXT)
ALT/SGPT (EXT): 25 U/L (ref 10–49)
AST/SGOT (EXT): 22 U/L (ref 6–40)
Albumin (EXT): 4.3 g/dL (ref 3.5–4.8)
Alkaline Phosphatase (EXT): 67 U/L (ref 27–129)
Anion Gap (EXT): 11 mmol/L (ref 3–17)
BUN (EXT): 15 mg/dL (ref 9–23)
Bilirubin, Total (EXT): 0.3 mg/dL (ref 0.0–1.0)
CO2 (EXT): 27 mmol/L (ref 20–31)
CalciumCalcium (EXT): 9.5 mg/dL (ref 8.7–10.4)
Chloride (EXT): 101 mmol/L (ref 95–106)
Creatinine (EXT): 1.03 mg/dL (ref 0.50–1.30)
GFR Estimated (Calc) (EXT): 82 mL/min/{1.73_m2} (ref >60)
Globulin (EXT): 2.6 g/dL (ref 1.9–4.1)
Potassium (EXT): 4.7 mmol/L (ref 3.5–5.2)
Protein (EXT): 6.9 g/dL (ref 5.7–8.2)
Sodium (EXT): 139 mmol/L (ref 136–145)

## 2023-01-27 LAB — UNMAPPED LAB RESULTS: Glucose (EXT): 126 mg/dL — ABNORMAL HIGH (ref 74–106)

## 2023-01-28 LAB — LIPID PROFILE (EXT)
Cholesterol (EXT): 110 mg/dL — ABNORMAL LOW (ref 140–200)
HDL Cholesterol (EXT): 43 mg/dL (ref >40)
LDL Cholesterol, CALC (EXT): 39 mg/dL (ref 0–129)
NON HDL Cholesterol (EXT): 67 mg/dL
Risk Factor (EXT): 2.5 (ref 0–5)
Triglycerides (EXT): 136 mg/dL (ref 0–149)

## 2023-01-28 LAB — UNMAPPED LAB RESULTS: Glucose (EXT): 93 mg/dL (ref 74–106)

## 2023-02-27 ENCOUNTER — Ambulatory Visit
Admission: RE | Admit: 2023-02-27 | Discharge: 2023-02-27 | Disposition: A | Discharge status: Home / Self Care | Payer: 59 | Source: Ambulatory Visit | Attending: Acute Care | Admitting: Acute Care

## 2023-02-27 DIAGNOSIS — Z122 Encounter for screening for malignant neoplasm of respiratory organs: Secondary | ICD-10-CM

## 2023-02-27 DIAGNOSIS — Z87891 Personal history of nicotine dependence: Secondary | ICD-10-CM

## 2023-02-27 DIAGNOSIS — F17210 Nicotine dependence, cigarettes, uncomplicated: Secondary | ICD-10-CM

## 2023-03-16 ENCOUNTER — Other Ambulatory Visit: Payer: Self-pay

## 2023-03-16 DIAGNOSIS — F17210 Nicotine dependence, cigarettes, uncomplicated: Secondary | ICD-10-CM

## 2023-03-16 DIAGNOSIS — Z87891 Personal history of nicotine dependence: Secondary | ICD-10-CM

## 2023-03-16 DIAGNOSIS — Z122 Encounter for screening for malignant neoplasm of respiratory organs: Secondary | ICD-10-CM

## 2023-05-25 LAB — LIPID PROFILE (EXT)
Cholesterol (EXT): 89 mg/dL — ABNORMAL LOW (ref 140–200)
HDL Cholesterol (EXT): 30 mg/dL — ABNORMAL LOW (ref >40)
LDL Cholesterol, CALC (EXT): 37 mg/dL (ref 0–129)
NON HDL Cholesterol (EXT): 59 mg/dL
Risk Factor (EXT): 3 (ref 0–5)
Triglycerides (EXT): 109 mg/dL (ref 0–149)

## 2023-05-25 LAB — UNMAPPED LAB RESULTS
ALT/SGPT (EXT): 32 U/L (ref 10–49)
AST/SGOT (EXT): 32 U/L (ref 6–40)
Alkaline Phosphatase (EXT): 75 U/L (ref 27–129)
